# Patient Record
Sex: Female | Born: 1960 | Race: Black or African American | Hispanic: No | Marital: Married | Smoking: Never smoker
Health system: Southern US, Community
[De-identification: ages and names within clinical notes are randomized; demographics above are authoritative.]

---

## 1998-11-08 ENCOUNTER — Inpatient Hospital Stay (HOSPITAL_COMMUNITY): Admission: RE | Admit: 1998-11-08 | Discharge: 1998-11-10 | Payer: Self-pay | Admitting: Gynecology

## 1998-11-29 ENCOUNTER — Ambulatory Visit (HOSPITAL_COMMUNITY): Admission: RE | Admit: 1998-11-29 | Discharge: 1998-11-29 | Payer: Self-pay | Admitting: Internal Medicine

## 1998-11-30 ENCOUNTER — Encounter: Payer: Self-pay | Admitting: Internal Medicine

## 2003-11-16 ENCOUNTER — Emergency Department (HOSPITAL_COMMUNITY): Admission: EM | Admit: 2003-11-16 | Discharge: 2003-11-16 | Payer: Self-pay | Admitting: Emergency Medicine

## 2003-11-21 ENCOUNTER — Emergency Department (HOSPITAL_COMMUNITY): Admission: EM | Admit: 2003-11-21 | Discharge: 2003-11-21 | Payer: Self-pay | Admitting: *Deleted

## 2004-10-28 ENCOUNTER — Emergency Department (HOSPITAL_COMMUNITY): Admission: EM | Admit: 2004-10-28 | Discharge: 2004-10-28 | Payer: Self-pay | Admitting: Family Medicine

## 2004-10-31 ENCOUNTER — Emergency Department (HOSPITAL_COMMUNITY): Admission: EM | Admit: 2004-10-31 | Discharge: 2004-10-31 | Payer: Self-pay | Admitting: Family Medicine

## 2005-03-17 ENCOUNTER — Ambulatory Visit (HOSPITAL_COMMUNITY): Admission: RE | Admit: 2005-03-17 | Discharge: 2005-03-17 | Payer: Self-pay | Admitting: Family Medicine

## 2005-03-17 ENCOUNTER — Emergency Department (HOSPITAL_COMMUNITY): Admission: EM | Admit: 2005-03-17 | Discharge: 2005-03-17 | Payer: Self-pay | Admitting: Family Medicine

## 2005-07-15 ENCOUNTER — Other Ambulatory Visit: Admission: RE | Admit: 2005-07-15 | Discharge: 2005-07-15 | Payer: Self-pay | Admitting: Gynecology

## 2007-12-14 ENCOUNTER — Other Ambulatory Visit: Admission: RE | Admit: 2007-12-14 | Discharge: 2007-12-14 | Payer: Self-pay | Admitting: Gynecology

## 2008-12-29 ENCOUNTER — Ambulatory Visit: Payer: Self-pay | Admitting: Family Medicine

## 2008-12-29 DIAGNOSIS — E039 Hypothyroidism, unspecified: Secondary | ICD-10-CM | POA: Insufficient documentation

## 2009-01-16 ENCOUNTER — Other Ambulatory Visit: Admission: RE | Admit: 2009-01-16 | Discharge: 2009-01-16 | Payer: Self-pay | Admitting: Gynecology

## 2009-01-16 ENCOUNTER — Encounter: Payer: Self-pay | Admitting: Gynecology

## 2009-01-16 ENCOUNTER — Ambulatory Visit: Payer: Self-pay | Admitting: Gynecology

## 2009-02-28 ENCOUNTER — Ambulatory Visit: Payer: Self-pay | Admitting: Family Medicine

## 2009-04-06 ENCOUNTER — Ambulatory Visit: Payer: Self-pay | Admitting: Family Medicine

## 2009-04-06 DIAGNOSIS — F411 Generalized anxiety disorder: Secondary | ICD-10-CM | POA: Insufficient documentation

## 2011-07-07 ENCOUNTER — Encounter: Payer: Self-pay | Admitting: Gynecology

## 2012-11-01 ENCOUNTER — Emergency Department (INDEPENDENT_AMBULATORY_CARE_PROVIDER_SITE_OTHER)
Admission: EM | Admit: 2012-11-01 | Discharge: 2012-11-01 | Disposition: A | Discharge status: Home / Self Care | Payer: Self-pay | Source: Home / Self Care | Attending: Family Medicine | Admitting: Family Medicine

## 2012-11-01 ENCOUNTER — Encounter (HOSPITAL_COMMUNITY): Payer: Self-pay | Admitting: *Deleted

## 2012-11-01 DIAGNOSIS — M545 Low back pain: Secondary | ICD-10-CM

## 2012-11-01 MED ORDER — METHYLPREDNISOLONE ACETATE 40 MG/ML IJ SUSP
80.0000 mg | Freq: Once | INTRAMUSCULAR | Status: AC
  Administered 2012-11-01: 80 mg via INTRAMUSCULAR

## 2012-11-01 MED ORDER — METHYLPREDNISOLONE ACETATE 80 MG/ML IJ SUSP
INTRAMUSCULAR | Status: AC
  Filled 2012-11-01: qty 1

## 2012-11-01 MED ORDER — DICLOFENAC POTASSIUM 50 MG PO TABS: 50.0000 mg | ORAL_TABLET | Freq: Three times a day (TID) | ORAL | Status: AC
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
## 2013-09-23 ENCOUNTER — Encounter: Payer: Self-pay | Admitting: Gynecology

## 2013-09-28 ENCOUNTER — Encounter: Payer: Self-pay | Admitting: Obstetrics & Gynecology

## 2013-10-24 ENCOUNTER — Ambulatory Visit: Payer: Self-pay | Admitting: Obstetrics & Gynecology

## 2014-07-31 ENCOUNTER — Encounter: Payer: Self-pay | Admitting: *Deleted

## 2014-08-01 ENCOUNTER — Encounter: Payer: Self-pay | Admitting: Obstetrics & Gynecology

## 2019-11-10 ENCOUNTER — Ambulatory Visit: Payer: Self-pay | Attending: Internal Medicine

## 2019-11-10 DIAGNOSIS — Z23 Encounter for immunization: Secondary | ICD-10-CM

## 2019-11-10 NOTE — Progress Notes (Signed)
   Covid-19 Vaccination Clinic  Name:  Jaianna Nicoll    MRN: [id number] DOB: Aug 25, 1960  11/10/2019  Ms. Kuna was observed post Covid-19 immunization for 15 minutes without incident. She was provided with Vaccine Information Sheet and instruction to access the V-Safe system.   Ms. Larocca was instructed to call 911 with any severe reactions post vaccine: Marland Kitchen Difficulty breathing  . Swelling of face and throat  . A fast heartbeat  . A bad rash all over body  . Dizziness and weakness   Immunizations Administered    Name Date Dose VIS Date Route   Pfizer COVID-19 Vaccine 11/10/2019 11:23 AM 0.3 mL 07/15/2019 Intramuscular   Manufacturer: [employer], [employer]   Lot: [id number]   NDC: [address]-3

## 2019-12-07 ENCOUNTER — Ambulatory Visit: Payer: Self-pay | Attending: Internal Medicine

## 2019-12-07 DIAGNOSIS — Z23 Encounter for immunization: Secondary | ICD-10-CM

## 2019-12-07 NOTE — Progress Notes (Signed)
   Covid-19 Vaccination Clinic  Name:  Rachel Hancock    MRN: [id number] DOB: 04-18-1961  12/07/2019  [name] was observed post Covid-19 immunization for 15 minutes without incident. She was provided with Vaccine Information Sheet and instruction to access the V-Safe system.   [name] was instructed to call 911 with any severe reactions post vaccine: Marland Kitchen Difficulty breathing  . Swelling of face and throat  . A fast heartbeat  . A bad rash all over body  . Dizziness and weakness   Immunizations Administered    Name Date Dose VIS Date Route   Pfizer COVID-19 Vaccine 12/07/2019  9:49 AM 0.3 mL 09/28/2018 Intramuscular   Manufacturer: [employer], [employer]   Lot: [id number]   NDC: [address]-6      Covid-19 Vaccination Clinic  Name:  Rachel Hancock    MRN: [id number] DOB: [birth year]  12/07/2019  [name] was observed post Covid-19 immunization for 15 minutes without incident. She was provided with Vaccine Information Sheet and instruction to access the V-Safe system.   [name] was instructed to call 911 with any severe reactions post vaccine: Marland Kitchen Difficulty breathing  . Swelling of face and throat  . A fast heartbeat  . A bad rash all over body  . Dizziness and weakness   Immunizations Administered    Name Date Dose VIS Date Route   Pfizer COVID-19 Vaccine 12/07/2019  9:49 AM 0.3 mL 09/28/2018 Intramuscular   Manufacturer: [employer], [employer]   Lot: [id number]   NDC: [address]-1

## 2020-07-22 ENCOUNTER — Encounter (HOSPITAL_COMMUNITY): Payer: Self-pay

## 2020-07-22 ENCOUNTER — Emergency Department (HOSPITAL_COMMUNITY)
Admission: EM | Admit: 2020-07-22 | Discharge: 2020-07-22 | Disposition: A | Discharge status: Home / Self Care | Attending: Emergency Medicine | Admitting: Emergency Medicine

## 2020-07-22 ENCOUNTER — Other Ambulatory Visit: Payer: Self-pay

## 2020-07-22 DIAGNOSIS — S76119A Strain of unspecified quadriceps muscle, fascia and tendon, initial encounter: Secondary | ICD-10-CM | POA: Diagnosis not present

## 2020-07-22 DIAGNOSIS — M791 Myalgia, unspecified site: Secondary | ICD-10-CM | POA: Diagnosis present

## 2020-07-22 DIAGNOSIS — Y92410 Unspecified street and highway as the place of occurrence of the external cause: Secondary | ICD-10-CM | POA: Diagnosis not present

## 2020-07-22 DIAGNOSIS — E039 Hypothyroidism, unspecified: Secondary | ICD-10-CM | POA: Diagnosis not present

## 2020-07-22 DIAGNOSIS — T148XXA Other injury of unspecified body region, initial encounter: Secondary | ICD-10-CM

## 2020-07-22 MED ORDER — IBUPROFEN 200 MG PO TABS
600.0000 mg | ORAL_TABLET | Freq: Once | ORAL | Status: AC
  Administered 2020-07-22: 600 mg via ORAL
  Filled 2020-07-22: qty 3

## 2020-07-22 MED ORDER — CYCLOBENZAPRINE HCL 10 MG PO TABS: 10.0000 mg | ORAL_TABLET | Freq: Every evening | ORAL | 0 refills | Status: AC | PRN

## 2020-07-22 NOTE — Discharge Instructions (Signed)
You can take 600 mg of ibuprofen every 6 hours, you can take 1000 mg of Tylenol every 6 hours, you can alternate these every 3 or you can take them together.  Stay well-hydrated and use the muscle relaxers to help you rest at night, also to the back stretching exercises provided at discharge.  Work back loose and prevent spasm and cramping
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
## 2020-10-10 ENCOUNTER — Other Ambulatory Visit: Payer: Self-pay | Admitting: Adult Medicine

## 2020-10-10 DIAGNOSIS — Z1231 Encounter for screening mammogram for malignant neoplasm of breast: Secondary | ICD-10-CM

## 2020-12-13 ENCOUNTER — Ambulatory Visit
Admission: RE | Admit: 2020-12-13 | Discharge: 2020-12-13 | Disposition: A | Discharge status: Home / Self Care | Source: Ambulatory Visit | Attending: Adult Medicine | Admitting: Adult Medicine

## 2020-12-13 DIAGNOSIS — Z1231 Encounter for screening mammogram for malignant neoplasm of breast: Secondary | ICD-10-CM

## 2021-09-29 IMAGING — MG MM DIGITAL SCREENING BILAT W/ TOMO AND CAD
6 of 10 series · 6 of 30 positions shown · non-contrast
Comparison: Previous exam(s).

CLINICAL DATA: Screening.

EXAM:
DIGITAL SCREENING BILATERAL MAMMOGRAM WITH TOMOSYNTHESIS AND CAD
TECHNIQUE: Bilateral screening digital craniocaudal and mediolateral oblique
mammograms were obtained. Bilateral screening digital breast
tomosynthesis was performed. The images were evaluated with
computer-aided detection.

[R CC synth-2D]
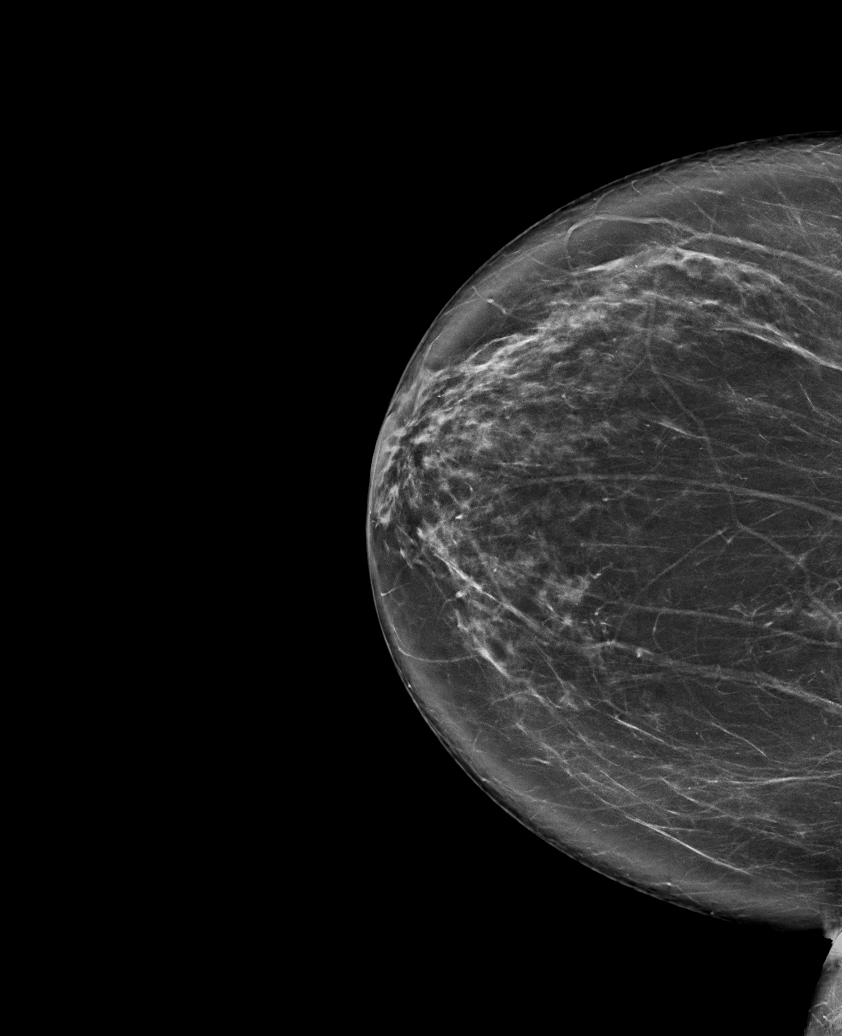

[L MLO synth-2D (1 of 2)]
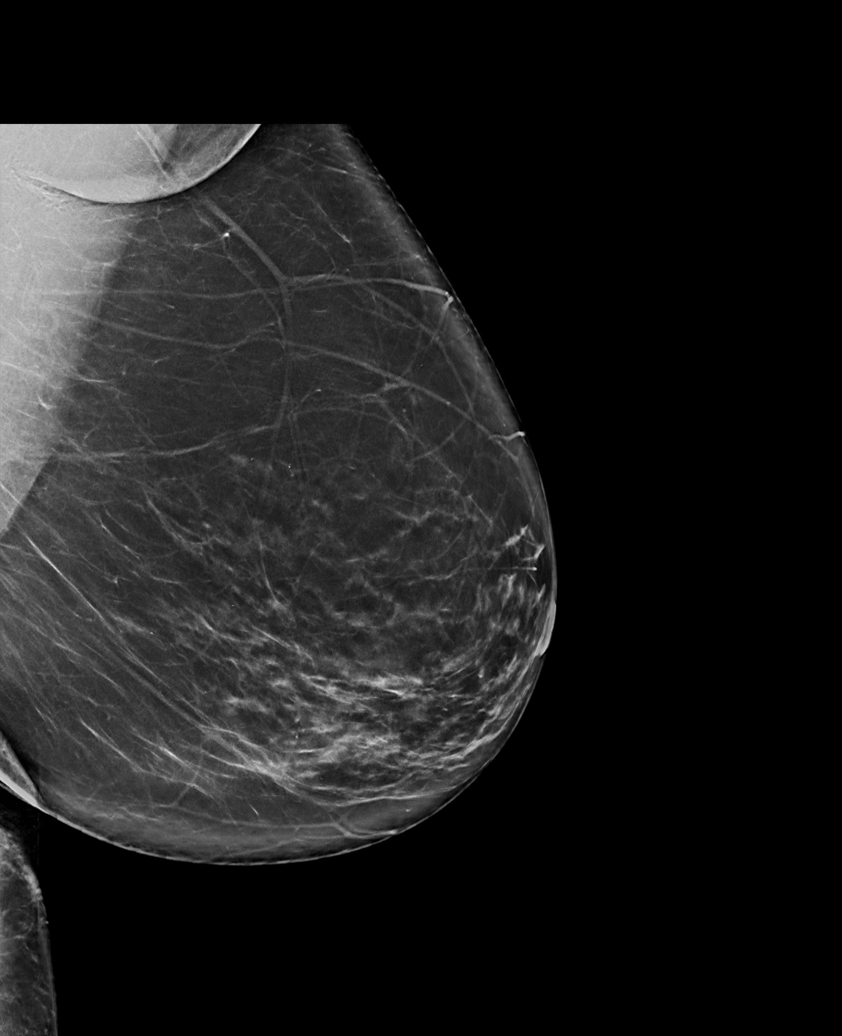

[R MLO synth-2D]
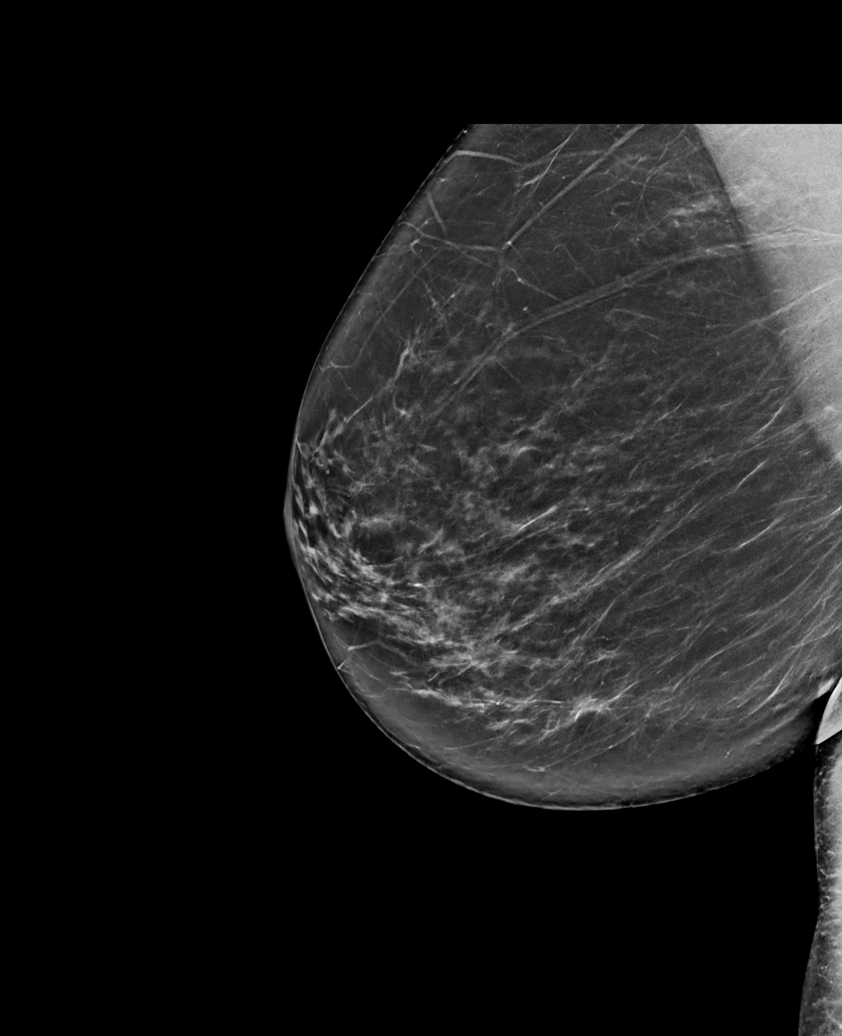

[L CC synth-2D]
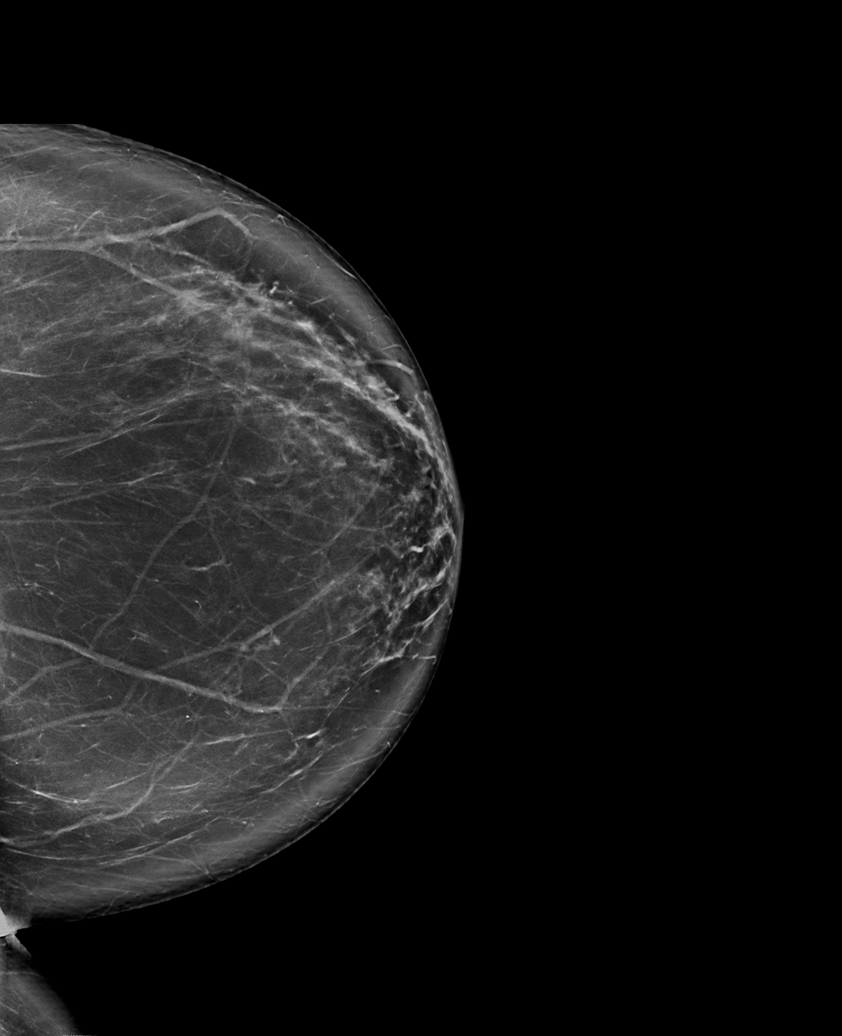

[L MLO synth-2D (2 of 2)]
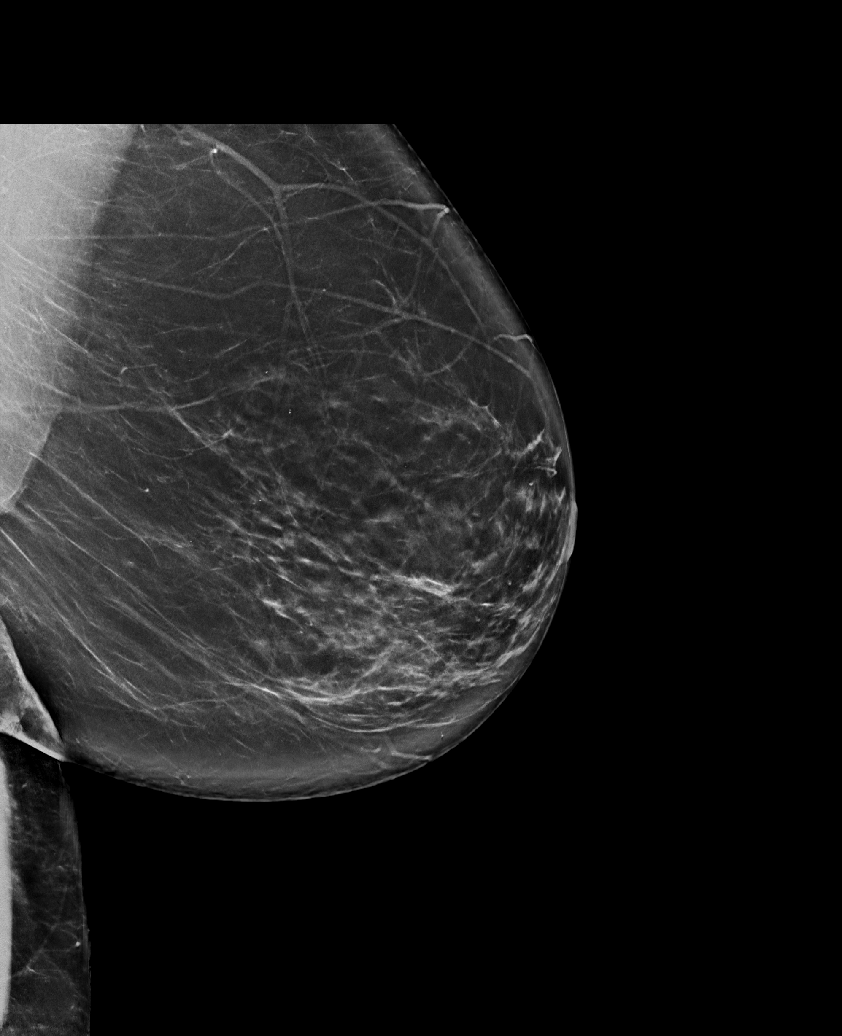

[L MLO tomo · tomo slice 47/92.0]
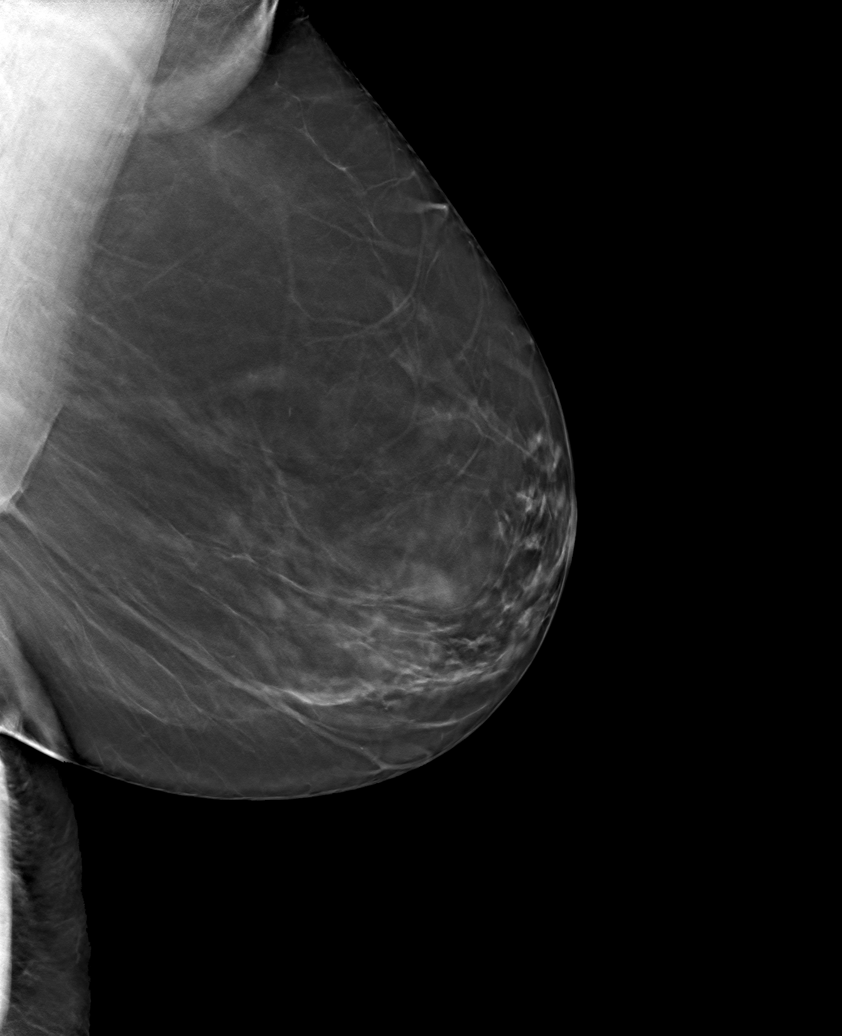

[6 of 30 positions shown; findings below may reference images not displayed]

ACR Breast Density Category c: The breast tissue is heterogeneously
dense, which may obscure small masses.
FINDINGS: There are no findings suspicious for malignancy. The images were
evaluated with computer-aided detection.
IMPRESSION: No mammographic evidence of malignancy. A result letter of this
screening mammogram will be mailed directly to the patient.

RECOMMENDATION:
Screening mammogram in one year. (Code:T4-5-GWO)

BI-RADS CATEGORY  1: Negative.

## 2022-01-23 ENCOUNTER — Other Ambulatory Visit: Payer: Self-pay | Admitting: Adult Medicine

## 2022-01-23 DIAGNOSIS — Z1231 Encounter for screening mammogram for malignant neoplasm of breast: Secondary | ICD-10-CM

## 2022-01-24 ENCOUNTER — Ambulatory Visit
Admission: RE | Admit: 2022-01-24 | Discharge: 2022-01-24 | Disposition: A | Discharge status: Home / Self Care | Source: Ambulatory Visit | Attending: Adult Medicine | Admitting: Adult Medicine

## 2022-01-24 ENCOUNTER — Ambulatory Visit

## 2022-01-24 DIAGNOSIS — Z1231 Encounter for screening mammogram for malignant neoplasm of breast: Secondary | ICD-10-CM

## 2022-06-20 ENCOUNTER — Ambulatory Visit (INDEPENDENT_AMBULATORY_CARE_PROVIDER_SITE_OTHER): Admitting: Podiatry

## 2022-06-20 ENCOUNTER — Encounter: Payer: Self-pay | Admitting: Podiatry

## 2022-06-20 ENCOUNTER — Ambulatory Visit (INDEPENDENT_AMBULATORY_CARE_PROVIDER_SITE_OTHER)

## 2022-06-20 DIAGNOSIS — R600 Localized edema: Secondary | ICD-10-CM | POA: Diagnosis not present

## 2022-06-20 DIAGNOSIS — S99921A Unspecified injury of right foot, initial encounter: Secondary | ICD-10-CM

## 2022-06-20 DIAGNOSIS — S92014A Nondisplaced fracture of body of right calcaneus, initial encounter for closed fracture: Secondary | ICD-10-CM
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
## 2022-07-04 ENCOUNTER — Inpatient Hospital Stay: Admission: RE | Admit: 2022-07-04 | Source: Ambulatory Visit

## 2022-07-09 ENCOUNTER — Ambulatory Visit
Admission: RE | Admit: 2022-07-09 | Discharge: 2022-07-09 | Disposition: A | Discharge status: Home / Self Care | Source: Ambulatory Visit | Attending: Podiatry | Admitting: Podiatry

## 2022-07-09 DIAGNOSIS — S92014A Nondisplaced fracture of body of right calcaneus, initial encounter for closed fracture: Secondary | ICD-10-CM

## 2022-07-14 NOTE — Progress Notes (Signed)
Interesting case. Probably will need fixation. You can have staff call and make appointment for her with you

## 2022-07-15 ENCOUNTER — Telehealth: Payer: Self-pay | Admitting: *Deleted

## 2022-07-15 NOTE — Telephone Encounter (Signed)
Patient is calling to schedule a f/u appointment to discuss the next step moving forward after CT scan

## 2022-07-18 ENCOUNTER — Ambulatory Visit (INDEPENDENT_AMBULATORY_CARE_PROVIDER_SITE_OTHER): Admitting: Podiatry

## 2022-07-18 ENCOUNTER — Encounter: Payer: Self-pay | Admitting: Podiatry

## 2022-07-18 DIAGNOSIS — S92011G Displaced fracture of body of right calcaneus, subsequent encounter for fracture with delayed healing: Secondary | ICD-10-CM

## 2022-07-21 NOTE — Progress Notes (Signed)
Subjective:   Patient ID: Rachel Hancock, female   DOB: 61 y.o.   MRN: [id number]   HPI Patient presents stating the swelling has gone down quite a bit not bearing any weight on it using crutches at the current time and I do have a knee scooter at home   ROS      Objective:  Physical Exam  Neurovascular status intact edema which is reduced quite a bit continued pain in the calcaneus secondary to injury with fracture of the calcaneus that is intra-articular     Assessment:  Unfortunate fracture of the calcaneus right secondary to injury that is intra-articular and displaced     Plan:  Reviewed condition and explained to patient and caregiver that it will require fixation and that there is a very good chance that it will require a subtalar joint fusion.  I will be referring this and hopefully this can be corrected in the next week patient is very comfortable with this understands wants surgery and hopefully we can get this done in the next week and education rendered to her today
